# Patient Record
Sex: Female | Born: 1970 | Race: Black or African American | Hispanic: No | Marital: Single | Smoking: Former smoker
Health system: Southern US, Community
[De-identification: ages and names within clinical notes are randomized; demographics above are authoritative.]

---

## 2015-08-08 ENCOUNTER — Emergency Department
Admission: EM | Admit: 2015-08-08 | Discharge: 2015-08-08 | Disposition: A | Discharge status: Home / Self Care | Payer: Self-pay | Attending: Student | Admitting: Student

## 2015-08-08 DIAGNOSIS — M545 Low back pain: Secondary | ICD-10-CM | POA: Insufficient documentation

## 2015-08-08 DIAGNOSIS — B349 Viral infection, unspecified: Secondary | ICD-10-CM | POA: Insufficient documentation

## 2015-08-08 DIAGNOSIS — F17200 Nicotine dependence, unspecified, uncomplicated: Secondary | ICD-10-CM | POA: Insufficient documentation

## 2015-08-08 DIAGNOSIS — R609 Edema, unspecified: Secondary | ICD-10-CM | POA: Insufficient documentation

## 2015-08-08 DIAGNOSIS — G8929 Other chronic pain: Secondary | ICD-10-CM | POA: Insufficient documentation

## 2015-08-08 MED ORDER — GUAIFENESIN-CODEINE 100-10 MG/5ML PO SOLN: 10.0000 mL | Freq: Three times a day (TID) | ORAL | Status: AC | PRN

## 2015-08-08 NOTE — ED Notes (Signed)
Patient started feeling bad two days ago. Complains of fever at home, back pain, stiffness all over. Good appetite. Denies N/V but states two episodes of diarrhea today. Patient with normal skin color for ethnicity, skin warm and dry and moist mucus membranes.

## 2015-08-08 NOTE — Discharge Instructions (Signed)
Edema °Edema is an abnormal buildup of fluids. It is more common in your legs and thighs. Painless swelling of the feet and ankles is more likely as a person ages. It also is common in looser skin, like around your eyes. °HOME CARE  °· Keep the affected body part above the level of the heart while lying down. °· Do not sit still or stand for a long time. °· Do not put anything right under your knees when you lie down. °· Do not wear tight clothes on your upper legs. °· Exercise your legs to help the puffiness (swelling) go down. °· Wear elastic bandages or support stockings as told by your doctor. °· A low-salt diet may help lessen the puffiness. °· Only take medicine as told by your doctor. °GET HELP IF: °· Treatment is not working. °· You have heart, liver, or kidney disease and notice that your skin looks puffy or shiny. °· You have puffiness in your legs that does not get better when you raise your legs. °· You have sudden weight gain for no reason. °GET HELP RIGHT AWAY IF:  °· You have shortness of breath or chest pain. °· You cannot breathe when you lie down. °· You have pain, redness, or warmth in the areas that are puffy. °· You have heart, liver, or kidney disease and get edema all of a sudden. °· You have a fever and your symptoms get worse all of a sudden. °MAKE SURE YOU:  °· Understand these instructions. °· Will watch your condition. °· Will get help right away if you are not doing well or get worse. °  °This information is not intended to replace advice given to you by your health care provider. Make sure you discuss any questions you have with your health care provider. °  °Document Released: 02/06/2008 Document Revised: 08/25/2013 Document Reviewed: 06/12/2013 °Elsevier Interactive Patient Education ©2016 [employer] Inc[employer] ° °

## 2015-08-08 NOTE — ED Notes (Signed)
Pt complaining of flu like symptoms that started today. Pt states she has generalized body aches, and is complaining of sweats and chills. Pt also states her lower back is hurting and she is having swelling to LLE. [facility] for swelling to left leg and states all test results came back negative.

## 2015-08-08 NOTE — ED Provider Notes (Signed)
[facility] Emergency Department Provider Note ____________________________________________  Time seen: Approximately 8:56 PM  I have reviewed the triage vital signs and the nursing notes.   HISTORY  Chief Complaint Influenza and Foot Swelling   HPI Diane Washington is a 44 y.o. female who presents to the emergency department for evaluation of flu like symptoms x 2 days.She reports cough, chills, body aches, and diarrhea. She also reports swelling to the left ankle and foot that is chronic. She's had multiple evaluations for the swelling and no definitive diagnosis has been made. She has not been taking any medications for her flulike symptoms. She states that staying off the foot and elevating it seems to make some of the swelling go down, but it never completely goes away. She denies a change in sensation of the left foot or change in skin color or temperature. She denies injury.   History reviewed. No pertinent past medical history.  There are no active problems to display for this patient.   Past Surgical History  Procedure Laterality Date  . Cesarean section      Current Outpatient Rx  Name  Route  Sig  Dispense  Refill  . guaiFENesin-codeine 100-10 MG/5ML syrup   Oral   Take 10 mLs by mouth 3 (three) times daily as needed.   120 mL   0     Allergies Review of patient's allergies indicates no known allergies.  No family history on file.  Social History Social History  Substance Use Topics  . Smoking status: Light Tobacco Smoker  . Smokeless tobacco: None  . Alcohol Use: Yes     Comment: Occasionally    Review of Systems Constitutional: Positive for fever and chills Eyes: No visual changes. ENT: No sore throat. Cardiovascular: Denies chest pain. Respiratory: Denies shortness of breath. Positive for cough Gastrointestinal: No abdominal pain.  No nausea, no vomiting.  Positive for diarrhea.  No constipation. Genitourinary:  Negative for dysuria. Musculoskeletal: Positive for low back pain which is chronic and generalized body aches Skin: Negative for rash. Neurological: Negative for headaches, focal weakness or numbness.  10-point ROS otherwise negative.  ____________________________________________   PHYSICAL EXAM:  VITAL SIGNS: ED Triage Vitals  Enc Vitals Group     BP 08/08/15 2015 126/75 mmHg     Pulse Rate 08/08/15 2015 85     Resp 08/08/15 2015 20     Temp 08/08/15 2015 98.3 F (36.8 C)     Temp Source 08/08/15 2015 Oral     SpO2 08/08/15 2015 100 %     Weight 08/08/15 2015 212 lb (96.163 kg)     Height 08/08/15 2015 5\' 5"  (1.651 m)     Head Cir --      Peak Flow --      Pain Score 08/08/15 2017 10     Pain Loc --      Pain Edu? --      Excl. in GC? --     Constitutional: Alert and oriented. Well appearing and in no acute distress. Eyes: Conjunctivae are normal. PERRL. EOMI. Head: Atraumatic. Nose: Nasal congestion noted without rhinorrhea. Mucous membranes are boggy and erythematous. Mouth/Throat: Mucous membranes are moist.  Oropharynx non-erythematous. Neck: No stridor.   Cardiovascular: Normal rate, regular rhythm. Grossly normal heart sounds.  Good peripheral circulation. Respiratory: Normal respiratory effort.  No retractions. Scattered expiratory wheezes noted throughout the right, left is clear to auscultation. Gastrointestinal: Soft and nontender. No distention. No abdominal bruits. No CVA  tenderness. Musculoskeletal: Left ankle and foot with nonpitting peripheral edema. There is some associated hair loss in the pretibial area, just above the left ankle where the swelling starts, however there is no lesion or skin breakdown. Good dorsalis pedis pulses and sensation bilaterally. Neurologic:  Normal speech and language. No gross focal neurologic deficits are appreciated. No gait instability. Skin:  Skin is warm, dry and intact. No rash noted. Psychiatric: Mood and affect are  normal. Speech and behavior are normal.  ____________________________________________   LABS (all labs ordered are listed, but only abnormal results are displayed)  Labs Reviewed - No data to display ____________________________________________  EKG   ____________________________________________  RADIOLOGY  Not indicated ____________________________________________   PROCEDURES  Procedure(s) performed: None  Critical Care performed: No  ____________________________________________   INITIAL IMPRESSION / ASSESSMENT AND PLAN / ED COURSE  Pertinent labs & imaging results that were available during my care of the patient were reviewed by me and considered in my medical decision making (see chart for details).  Patient was advised that she will need to establish primary care provider for further evaluation of the left foot swelling. She was advised to use some compression stockings or Ace wrap when out of bed. Patient is not a daily smoker and symptoms started 2 days ago. She will be treated with Robitussin-AC and advised to increase fluid intake and take Tylenol or ibuprofen for body aches and fever. She was advised to return to the emergency department for symptoms that change or worsen if she is unable to schedule an appointment. ____________________________________________   FINAL CLINICAL IMPRESSION(S) / ED DIAGNOSES  Final diagnoses:  Viral syndrome  Peripheral edema      Chinita Pester, FNP 08/08/15 2132  Gayla Doss, MD 08/09/15 [phone]

## 2015-09-12 ENCOUNTER — Emergency Department: Payer: Self-pay

## 2015-09-12 ENCOUNTER — Emergency Department
Admission: EM | Admit: 2015-09-12 | Discharge: 2015-09-12 | Disposition: A | Discharge status: Home / Self Care | Payer: Self-pay | Attending: Emergency Medicine | Admitting: Emergency Medicine

## 2015-09-12 ENCOUNTER — Encounter: Payer: Self-pay | Admitting: Emergency Medicine

## 2015-09-12 DIAGNOSIS — Z87891 Personal history of nicotine dependence: Secondary | ICD-10-CM | POA: Insufficient documentation

## 2015-09-12 DIAGNOSIS — M7080 Other soft tissue disorders related to use, overuse and pressure of unspecified site: Secondary | ICD-10-CM

## 2015-09-12 DIAGNOSIS — Y998 Other external cause status: Secondary | ICD-10-CM | POA: Insufficient documentation

## 2015-09-12 DIAGNOSIS — Y9389 Activity, other specified: Secondary | ICD-10-CM | POA: Insufficient documentation

## 2015-09-12 DIAGNOSIS — Y9289 Other specified places as the place of occurrence of the external cause: Secondary | ICD-10-CM | POA: Insufficient documentation

## 2015-09-12 DIAGNOSIS — X58XXXA Exposure to other specified factors, initial encounter: Secondary | ICD-10-CM | POA: Insufficient documentation

## 2015-09-12 DIAGNOSIS — T148XXA Other injury of unspecified body region, initial encounter: Secondary | ICD-10-CM

## 2015-09-12 DIAGNOSIS — S4992XA Unspecified injury of left shoulder and upper arm, initial encounter: Secondary | ICD-10-CM | POA: Insufficient documentation

## 2015-09-12 MED ORDER — TRAMADOL HCL 50 MG PO TABS: 50.0000 mg | ORAL_TABLET | Freq: Four times a day (QID) | ORAL | Status: AC | PRN

## 2015-09-12 MED ORDER — TRAMADOL HCL 50 MG PO TABS
50.0000 mg | ORAL_TABLET | Freq: Once | ORAL | Status: AC
  Administered 2015-09-12: 50 mg via ORAL
  Filled 2015-09-12: qty 1

## 2015-09-12 MED ORDER — MELOXICAM 15 MG PO TABS: 15.0000 mg | ORAL_TABLET | Freq: Every day | ORAL | Status: AC

## 2015-09-12 MED ORDER — NAPROXEN 500 MG PO TABS
500.0000 mg | ORAL_TABLET | Freq: Once | ORAL | Status: AC
  Administered 2015-09-12: 500 mg via ORAL
  Filled 2015-09-12: qty 1

## 2015-09-12 NOTE — ED Notes (Signed)
Left shoulder pain for couple of days   W/o injury  No swelling or deformity noted

## 2015-09-12 NOTE — ED Notes (Signed)
L shoulder pain, denies injury to area, states she thinks it is how she drives.

## 2015-09-12 NOTE — ED Notes (Signed)
Left shoulder pain with no obvious injury. No bruising, joint feels in alignment. Pt denies injury.

## 2015-09-12 NOTE — ED Provider Notes (Signed)
[facility] Emergency Department Provider Note  ____________________________________________  Time seen: Approximately 2:56 PM  I have reviewed the triage vital signs and the nursing notes.   HISTORY  Chief Complaint Shoulder Pain    HPI Diane Washington is a 45 y.o. female patient complaining of left shoulder pain for 1 week. 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 He denies any provocative incident for her complaint of pain. Patient rates the pain as 8/10 and describes pain as sharp. No palliative measures taken of his complaint.   History reviewed. No pertinent past medical history.  There are no active problems to display for this patient.   Past Surgical History  Procedure Laterality Date  . Cesarean section      Current Outpatient Rx  Name  Route  Sig  Dispense  Refill  . guaiFENesin-codeine 100-10 MG/5ML syrup   Oral   Take 10 mLs by mouth 3 (three) times daily as needed.   120 mL   0   . meloxicam (MOBIC) 15 MG tablet   Oral   Take 1 tablet (15 mg total) by mouth daily.   30 tablet   2   . traMADol (ULTRAM) 50 MG tablet   Oral   Take 1 tablet (50 mg total) by mouth every 6 (six) hours as needed for moderate pain.   12 tablet   0     Allergies Review of patient's allergies indicates no known allergies.  No family history on file.  Social History Social History  Substance Use Topics  . Smoking status: Former Games developer  . Smokeless tobacco: None  . Alcohol Use: Yes     Comment: Occasionally    Review of Systems Constitutional: No fever/chills Eyes: No visual changes. ENT: No sore throat. Cardiovascular: Denies chest pain. Respiratory: Denies shortness of breath. Gastrointestinal: No abdominal pain.  No nausea, no vomiting.  No diarrhea.  No constipation. Genitourinary: Negative for dysuria. Musculoskeletal: Left shoulder pain Skin: Negative for rash. Neurological:  Negative for headaches, focal weakness or numbness. 10-point ROS otherwise negative.  ____________________________________________   PHYSICAL EXAM:  VITAL SIGNS: ED Triage Vitals  Enc Vitals Group     BP 09/12/15 1312 132/83 mmHg     Pulse Rate 09/12/15 1312 70     Resp 09/12/15 1312 18     Temp 09/12/15 1312 98.1 F (36.7 C)     Temp Source 09/12/15 1312 Oral     SpO2 09/12/15 1312 100 %     Weight 09/12/15 1312 212 lb (96.163 kg)     Height 09/12/15 1312 5\' 5"  (1.651 m)     Head Cir --      Peak Flow --      Pain Score 09/12/15 1306 8     Pain Loc --      Pain Edu? --      Excl. in GC? --     Constitutional: Alert and oriented. Well appearing and in no acute distress. Eyes: Conjunctivae are normal. PERRL. EOMI. Head: Atraumatic. Nose: No congestion/rhinnorhea. Mouth/Throat: Mucous membranes are moist.  Oropharynx non-erythematous. Neck: No stridor.  No cervical spine tenderness to palpation. Hematological/Lymphatic/Immunilogical: No cervical lymphadenopathy. Cardiovascular: Normal rate, regular rhythm. Grossly normal heart sounds.  Good peripheral circulation. Respiratory: Normal respiratory effort.  No retractions. Lungs CTAB. Gastrointestinal: Soft and nontender. No distention. No abdominal bruits. No CVA tenderness. Musculoskeletal: No obvious deformity of the left shoulder. His range of motion limited by complaining  of pain . Patient has some moderate guarding palpation at the [facility].  Neurologic:  Normal speech and language. No gross focal neurologic deficits are appreciated. No gait instability. Skin:  Skin is warm, dry and intact. No rash noted. Psychiatric: Mood and affect are normal. Speech and behavior are normal.  ____________________________________________   LABS (all labs ordered are listed, but only abnormal results are displayed)  Labs Reviewed - No data to  display ____________________________________________  EKG   ____________________________________________  RADIOLOGY  No acute findings on x-ray. ____________________________________________   PROCEDURES  Procedure(s) performed: None  Critical Care performed: No  ____________________________________________   INITIAL IMPRESSION / ASSESSMENT AND PLAN / ED COURSE  Pertinent labs & imaging results that were available during my care of the patient were reviewed by me and considered in my medical decision making (see chart for details).  Repetitive motion injury to the right upper shoulder. Patient given discharge care instructions and a prescription for meloxicam and and tramadol. Patient advised to [facility] condition persists ____________________________________________   FINAL CLINICAL IMPRESSION(S) / ED DIAGNOSES  Final diagnoses:  Repetitive motion injury      Joni ReiningRonald K Smith, PA-C 09/12/15 1527  Emily FilbertJonathan E Williams, MD 09/12/15 [phone]

## 2015-10-20 ENCOUNTER — Emergency Department
Admission: EM | Admit: 2015-10-20 | Discharge: 2015-10-20 | Disposition: A | Discharge status: Home / Self Care | Payer: Self-pay | Attending: Emergency Medicine | Admitting: Emergency Medicine

## 2015-10-20 ENCOUNTER — Encounter: Payer: Self-pay | Admitting: Emergency Medicine

## 2015-10-20 DIAGNOSIS — Z87891 Personal history of nicotine dependence: Secondary | ICD-10-CM | POA: Insufficient documentation

## 2015-10-20 DIAGNOSIS — A0811 Acute gastroenteropathy due to Norwalk agent: Secondary | ICD-10-CM | POA: Insufficient documentation

## 2015-10-20 LAB — URINALYSIS COMPLETE WITH MICROSCOPIC (ARMC ONLY)
BACTERIA UA: NONE SEEN
Bilirubin Urine: NEGATIVE
GLUCOSE, UA: NEGATIVE mg/dL
HGB URINE DIPSTICK: NEGATIVE
Ketones, ur: NEGATIVE mg/dL
Nitrite: NEGATIVE
PROTEIN: NEGATIVE mg/dL
Specific Gravity, Urine: 1.031 — ABNORMAL HIGH (ref 1.005–1.030)
pH: 5 (ref 5.0–8.0)

## 2015-10-20 LAB — COMPREHENSIVE METABOLIC PANEL
ALBUMIN: 4.3 g/dL (ref 3.5–5.0)
ALT: 14 U/L (ref 14–54)
ANION GAP: 6 (ref 5–15)
AST: 20 U/L (ref 15–41)
Alkaline Phosphatase: 61 U/L (ref 38–126)
BUN: 16 mg/dL (ref 6–20)
CHLORIDE: 107 mmol/L (ref 101–111)
CO2: 24 mmol/L (ref 22–32)
Calcium: 8.8 mg/dL — ABNORMAL LOW (ref 8.9–10.3)
Creatinine, Ser: 0.91 mg/dL (ref 0.44–1.00)
GFR calc Af Amer: >60 mL/min (ref >60)
GFR calc non Af Amer: >60 mL/min (ref >60)
GLUCOSE: 116 mg/dL — AB (ref 65–99)
POTASSIUM: 3.7 mmol/L (ref 3.5–5.1)
SODIUM: 137 mmol/L (ref 135–145)
Total Bilirubin: 0.5 mg/dL (ref 0.3–1.2)
Total Protein: 7.8 g/dL (ref 6.5–8.1)

## 2015-10-20 LAB — CBC
HEMATOCRIT: 39.4 % (ref 35.0–47.0)
HEMOGLOBIN: 12.9 g/dL (ref 12.0–16.0)
MCH: 30 pg (ref 26.0–34.0)
MCHC: 32.7 g/dL (ref 32.0–36.0)
MCV: 91.5 fL (ref 80.0–100.0)
Platelets: 224 10*3/uL (ref 150–440)
RBC: 4.3 MIL/uL (ref 3.80–5.20)
RDW: 13.4 % (ref 11.5–14.5)
WBC: 6.7 10*3/uL (ref 3.6–11.0)

## 2015-10-20 LAB — LIPASE, BLOOD: LIPASE: 45 U/L (ref 11–51)

## 2015-10-20 MED ORDER — ONDANSETRON 4 MG PO TBDP
4.0000 mg | ORAL_TABLET | Freq: Once | ORAL | Status: AC
  Administered 2015-10-20: 4 mg via ORAL
  Filled 2015-10-20: qty 1

## 2015-10-20 MED ORDER — ONDANSETRON HCL 4 MG PO TABS: 4.0000 mg | ORAL_TABLET | Freq: Every day | ORAL | Status: AC | PRN

## 2015-10-20 MED ORDER — FAMOTIDINE 20 MG PO TABS: 20.0000 mg | ORAL_TABLET | Freq: Two times a day (BID) | ORAL | Status: AC

## 2015-10-20 MED ORDER — FAMOTIDINE 20 MG PO TABS
20.0000 mg | ORAL_TABLET | Freq: Once | ORAL | Status: AC
  Administered 2015-10-20: 20 mg via ORAL
  Filled 2015-10-20: qty 1

## 2015-10-20 MED ORDER — LOPERAMIDE HCL 2 MG PO CAPS
4.0000 mg | ORAL_CAPSULE | Freq: Once | ORAL | Status: AC
  Administered 2015-10-20: 4 mg via ORAL
  Filled 2015-10-20: qty 2

## 2015-10-20 NOTE — ED Provider Notes (Signed)
[facility] Emergency Department Provider Note     Time seen: ----------------------------------------- 12:35 PM on 10/20/2015 -----------------------------------------    I have reviewed the triage vital signs and the nursing notes.   HISTORY  Chief Complaint Emesis and Diarrhea    HPI Diane Washington is a 45 y.o. female who presents to the ER with diarrhea that has been copious and watery, vomiting and acid reflux for the last 24 hours. Patient states she ate yesterday and subsequently had acid indigestion followed by vomiting and she has had nonstop diarrhea. Nothing makes her symptoms better. She denies history of same.   History reviewed. No pertinent past medical history.  There are no active problems to display for this patient.   Past Surgical History  Procedure Laterality Date  . Cesarean section      Allergies Review of patient's allergies indicates no known allergies.  Social History Social History  Substance Use Topics  . Smoking status: Former Games developer  . Smokeless tobacco: None  . Alcohol Use: Yes     Comment: Occasionally    Review of Systems Constitutional: Negative for fever. Eyes: Negative for visual changes. ENT: Negative for sore throat. Cardiovascular: Negative for chest pain. Respiratory: Negative for shortness of breath. Gastrointestinal: Negative for abdominal pain, positive for vomiting and diarrhea Genitourinary: Negative for dysuria. Musculoskeletal: Negative for back pain. Skin: Negative for rash. Neurological: Negative for headaches, focal weakness or numbness.  10-point ROS otherwise negative.  ____________________________________________   PHYSICAL EXAM:  VITAL SIGNS: ED Triage Vitals  Enc Vitals Group     BP 10/20/15 1103 120/72 mmHg     Pulse Rate 10/20/15 1103 95     Resp 10/20/15 1103 18     Temp 10/20/15 1103 98.8 F (37.1 C)     Temp Source 10/20/15 1103 Oral     SpO2 10/20/15 1103  100 %     Weight 10/20/15 1103 215 lb (97.523 kg)     Height 10/20/15 1103  (1.651 m)     Head Cir --      Peak Flow --      Pain Score 10/20/15 1104 8     Pain Loc --      Pain Edu? --      Excl. in GC? --     Constitutional: Alert and oriented. Well appearing and in no distress. Eyes: Conjunctivae are normal. PERRL. Normal extraocular movements. ENT   Head: Normocephalic and atraumatic.   Nose: No congestion/rhinnorhea.   Mouth/Throat: Mucous membranes are moist.   Neck: No stridor. Cardiovascular: Normal rate, regular rhythm. Normal and symmetric distal pulses are present in all extremities. No murmurs, rubs, or gallops. Respiratory: Normal respiratory effort without tachypnea nor retractions. Breath sounds are clear and equal bilaterally. No wheezes/rales/rhonchi. Gastrointestinal: Soft and nontender. No distention. No abdominal bruits.  Musculoskeletal: Nontender with normal range of motion in all extremities. No joint effusions.  No lower extremity tenderness nor edema. Neurologic:  Normal speech and language. No gross focal neurologic deficits are appreciated.  Skin:  Skin is warm, dry and intact. No rash noted. Psychiatric: Mood and affect are normal. Speech and behavior are normal. Patient exhibits appropriate insight and judgment. ___________________________________________  ED COURSE:  Pertinent labs & imaging results that were available during my care of the patient were reviewed by me and considered in my medical decision making (see chart for details). Patient is in no acute distress, likely Norovirus infection. Patient was given antiemetics and antidiarrheal agents and  started on Pepcid. She'll be prescribed similar and encouraged to have follow-up with her doctor. ____________________________________________    LABS (pertinent positives/negatives)  Labs Reviewed  COMPREHENSIVE METABOLIC PANEL - Abnormal; Notable for the following:    Glucose, Bld  116 (*)    Calcium 8.8 (*)    All other components within normal limits  URINALYSIS COMPLETEWITH MICROSCOPIC ([facility] ONLY) - Abnormal; Notable for the following:    Color, Urine YELLOW (*)    APPearance HAZY (*)    Specific Gravity, Urine 1.031 (*)    Leukocytes, UA TRACE (*)    Squamous Epithelial / LPF 6-30 (*)    All other components within normal limits  LIPASE, BLOOD  CBC  ____________________________________________  FINAL ASSESSMENT AND PLAN  Norovirus infection  Plan: Patient with labs as dictated above. As dictated above, she will continue outpatient follow-up with oral antiemetics and an acids.   Emily Filbert, MD   Emily Filbert, MD 10/20/15 [phone]

## 2015-10-20 NOTE — Discharge Instructions (Signed)
Norovirus Infection °A norovirus infection is caused by exposure to a virus in a group of similar viruses (noroviruses). This type of infection causes inflammation in your stomach and intestines (gastroenteritis). Norovirus is the most common cause of gastroenteritis. It also causes food poisoning. °Anyone can get a norovirus infection. It spreads very easily (contagious). You can get it from contaminated food, water, surfaces, or other people. Norovirus is found in the stool or vomit of infected people. You can spread the infection as soon as you feel sick until 2 weeks after you recover.  °Symptoms usually begin within 2 days after you become infected. Most norovirus symptoms affect the digestive system. °CAUSES °Norovirus infection is caused by contact with norovirus. You can catch norovirus if you: °· Eat or drink something contaminated with norovirus. °· Touch surfaces or objects contaminated with norovirus and then put your hand in your mouth. °· Have direct contact with an infected person who has symptoms. °· Share food, drink, or utensils with someone with who is sick with norovirus. °SIGNS AND SYMPTOMS °Symptoms of norovirus may include: °· Nausea. °· Vomiting. °· Diarrhea. °· Stomach cramps. °· Fever. °· Chills. °· Headache. °· Muscle aches. °· Tiredness. °DIAGNOSIS °Your health care provider may suspect norovirus based on your symptoms and physical exam. Your health care provider may also test a sample of your stool or vomit for the virus.  °TREATMENT °There is no specific treatment for norovirus. Most people get better without treatment in about 2 days. °HOME CARE INSTRUCTIONS °· Replace lost fluids by drinking plenty of water or rehydration fluids containing important minerals called electrolytes. This prevents dehydration. Drink enough fluid to keep your urine clear or pale yellow. °· Do not prepare food for others while you are infected. Wait at least 3 days after recovering from the illness to do  that. °PREVENTION  °· Wash your hands often, especially after using the toilet or changing a diaper. °· Wash fruits and vegetables thoroughly before preparing or serving them. °· Throw out any food that a sick person may have touched. °· Disinfect contaminated surfaces immediately after someone in the household has been sick. Use a bleach-based household cleaner. °· Immediately remove and wash soiled clothes or sheets. °SEEK MEDICAL CARE IF: °· Your vomiting, diarrhea, and stomach pain is getting worse. °· Your symptoms of norovirus do not go away after 2-3 days. °SEEK IMMEDIATE MEDICAL CARE IF:  °You develop symptoms of dehydration that do not improve with fluid replacement. This may include: °· Excessive sleepiness. °· Lack of tears. °· Dry mouth. °· Dizziness when standing. °· Weak pulse. °  °This information is not intended to replace advice given to you by your health care provider. Make sure you discuss any questions you have with your health care provider. °  °Document Released: 11/10/2002 Document Revised: 09/10/2014 Document Reviewed: 01/28/2014 °Elsevier Interactive Patient Education ©2016 [employer] Inc. ° °

## 2015-10-20 NOTE — ED Notes (Signed)
Pt here with c/o diarrhea, vomiting and acid reflux for one day now.

## 2016-05-14 IMAGING — CR DG SHOULDER 2+V*L*
1 series · 3 of 3 positions shown · non-contrast
Comparison: None.

CLINICAL DATA: Left shoulder pain for 10 days

EXAM:
LEFT SHOULDER - 2+ VIEW

[Series 1: dg shoulder left · 0.14mm/px · 3 of 3 slices shown]
[im 1/3]
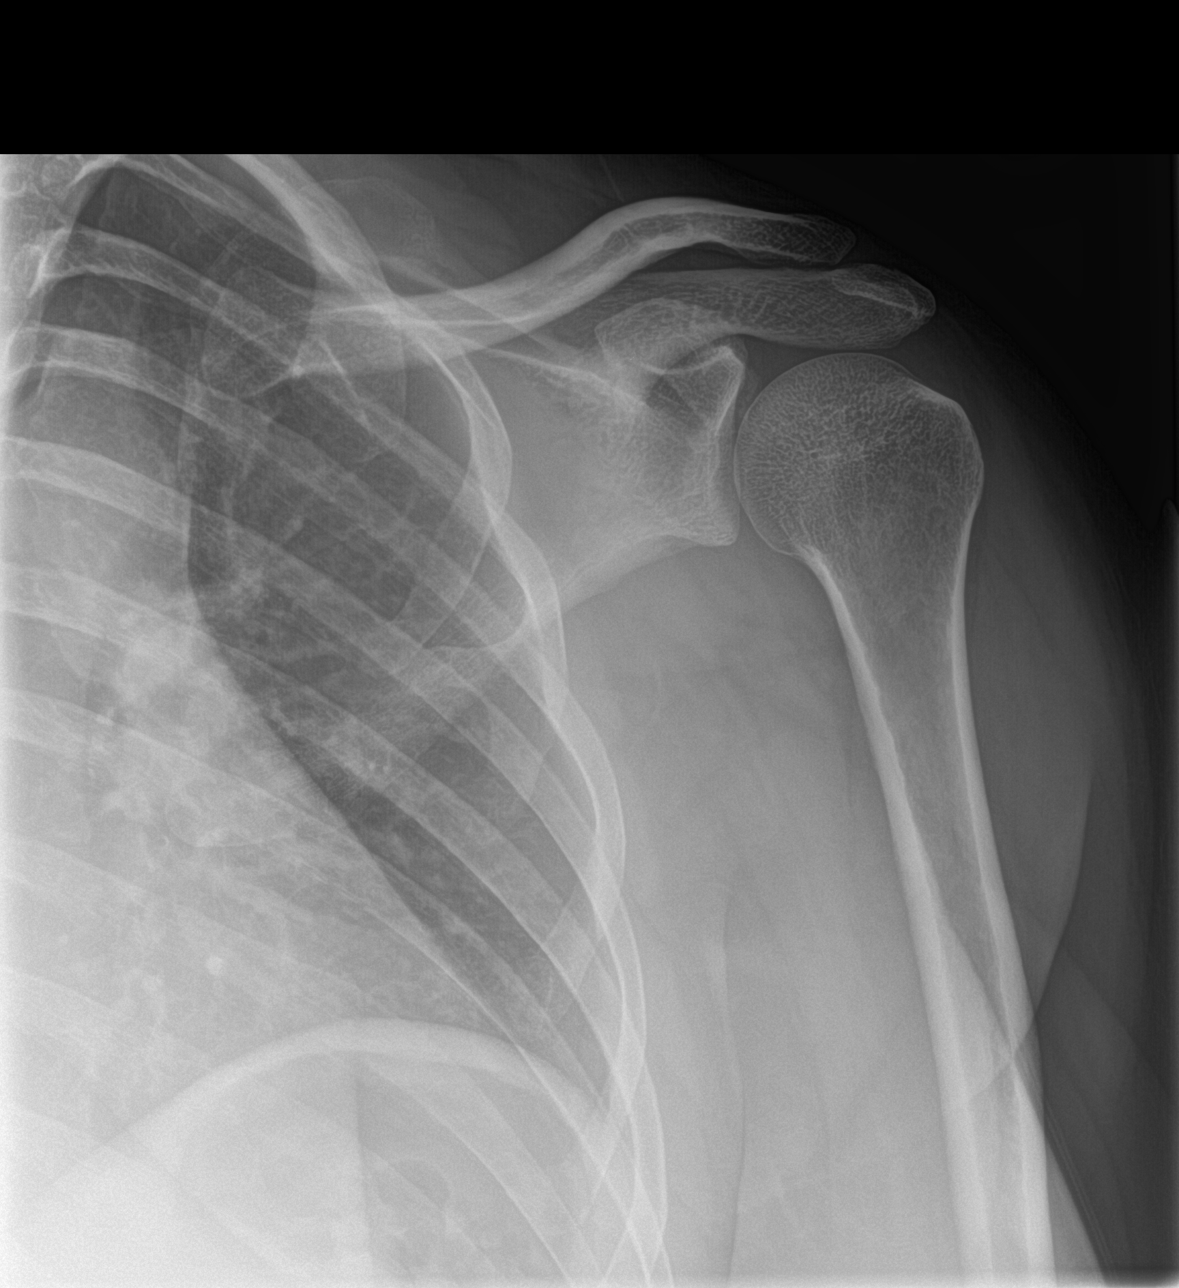
[im 2/3]
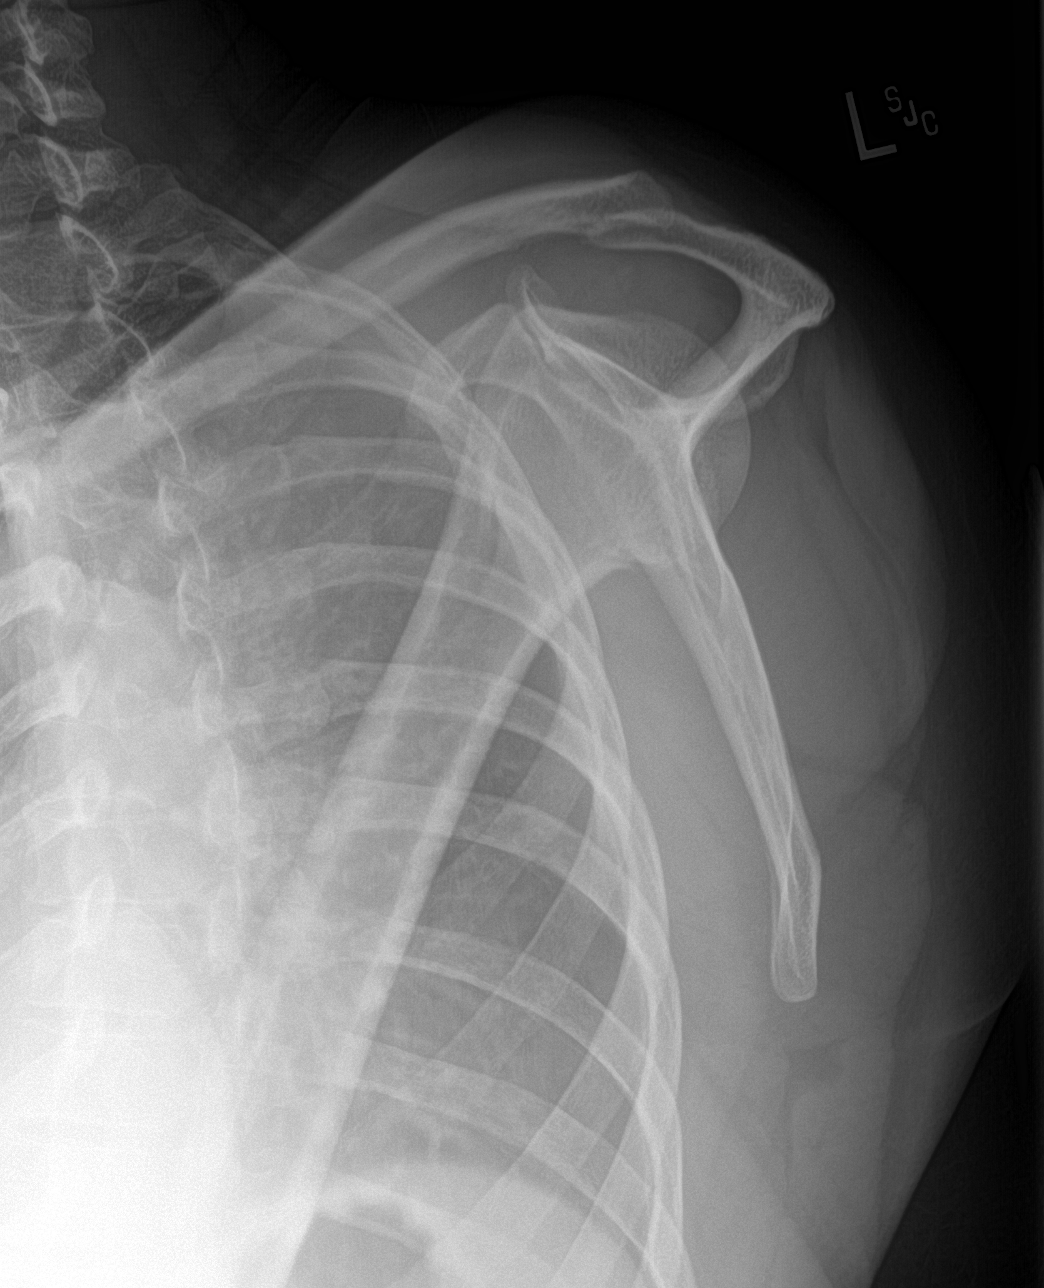
[im 3/3]
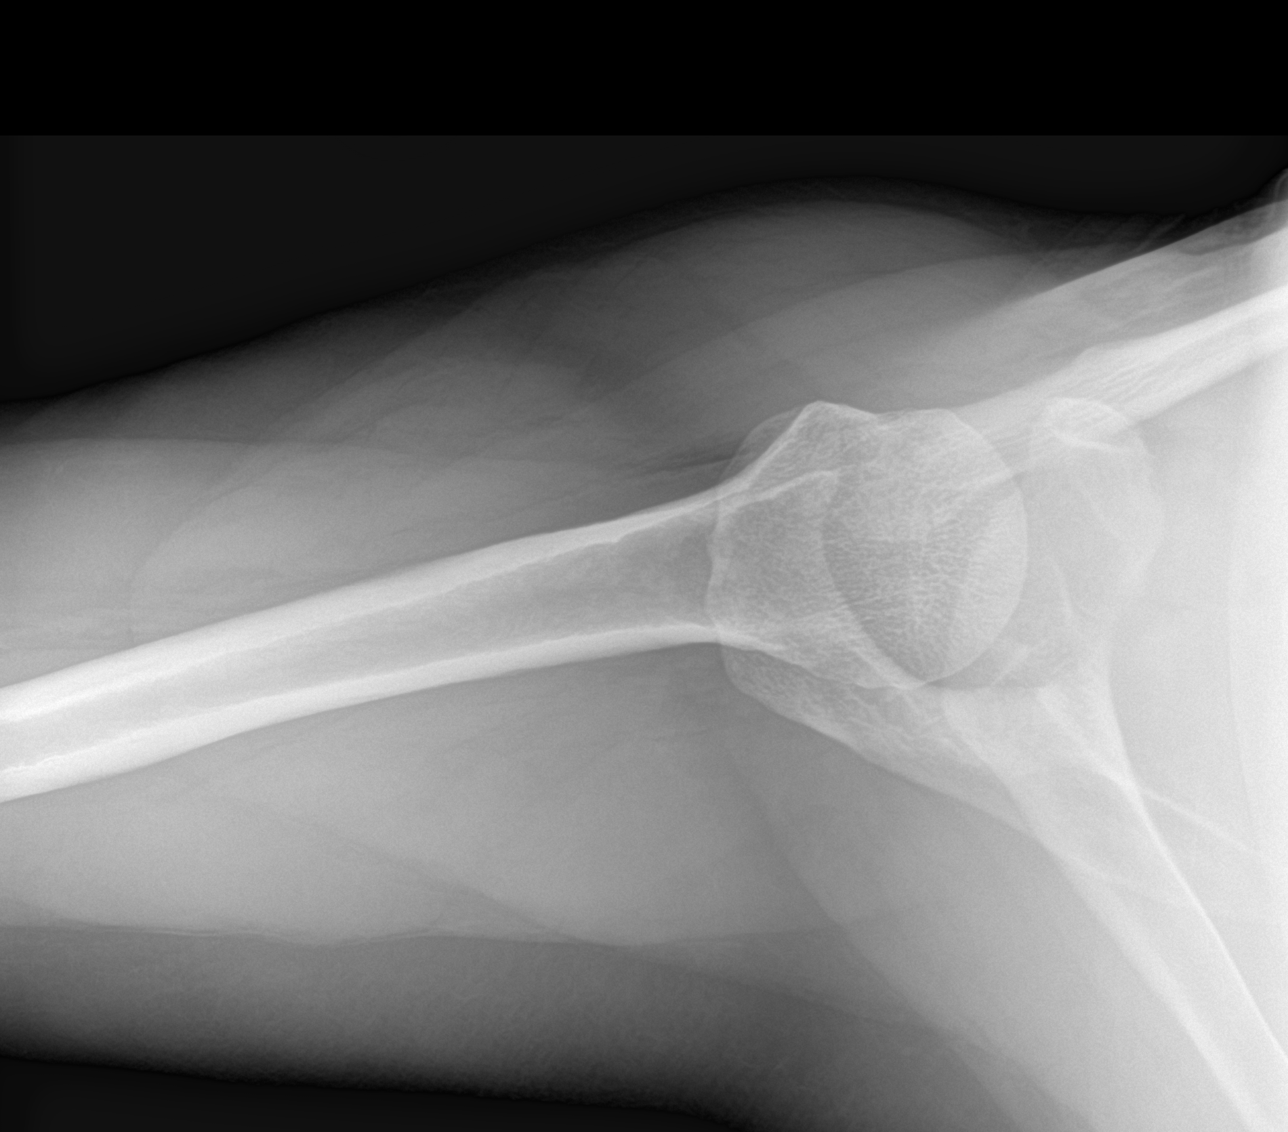

[3 of 3 positions shown; findings below may reference images not displayed]

FINDINGS: Three views of left shoulder submitted. No acute fracture or
subluxation. No radiopaque foreign body. AC joint and glenohumeral
joint are preserved.
IMPRESSION: Negative.

## 2018-10-13 ENCOUNTER — Encounter: Payer: Self-pay | Admitting: Emergency Medicine

## 2018-10-13 ENCOUNTER — Emergency Department: Payer: 59

## 2018-10-13 ENCOUNTER — Other Ambulatory Visit: Payer: Self-pay

## 2018-10-13 ENCOUNTER — Emergency Department
Admission: EM | Admit: 2018-10-13 | Discharge: 2018-10-13 | Disposition: A | Discharge status: Home / Self Care | Payer: 59 | Attending: Emergency Medicine | Admitting: Emergency Medicine

## 2018-10-13 DIAGNOSIS — Z87891 Personal history of nicotine dependence: Secondary | ICD-10-CM | POA: Diagnosis not present

## 2018-10-13 DIAGNOSIS — R079 Chest pain, unspecified: Secondary | ICD-10-CM | POA: Diagnosis not present

## 2018-10-13 LAB — CBC
HCT: 39.3 % (ref 36.0–46.0)
HEMOGLOBIN: 12.9 g/dL (ref 12.0–15.0)
MCH: 29.6 pg (ref 26.0–34.0)
MCHC: 32.8 g/dL (ref 30.0–36.0)
MCV: 90.1 fL (ref 80.0–100.0)
Platelets: 306 10*3/uL (ref 150–400)
RBC: 4.36 MIL/uL (ref 3.87–5.11)
RDW: 13.2 % (ref 11.5–15.5)
WBC: 5.9 10*3/uL (ref 4.0–10.5)
nRBC: 0 % (ref 0.0–0.2)

## 2018-10-13 LAB — BASIC METABOLIC PANEL
ANION GAP: 5 (ref 5–15)
BUN: 17 mg/dL (ref 6–20)
CALCIUM: 9.5 mg/dL (ref 8.9–10.3)
CO2: 29 mmol/L (ref 22–32)
Chloride: 104 mmol/L (ref 98–111)
Creatinine, Ser: 0.92 mg/dL (ref 0.44–1.00)
Glucose, Bld: 92 mg/dL (ref 70–99)
POTASSIUM: 3.7 mmol/L (ref 3.5–5.1)
Sodium: 138 mmol/L (ref 135–145)

## 2018-10-13 LAB — TROPONIN I: Troponin I: <0.03 ng/mL (ref <0.03)

## 2018-10-13 NOTE — ED Provider Notes (Signed)
[facility] Emergency Department Provider Note       Time seen: ----------------------------------------- 3:21 PM on 10/13/2018 -----------------------------------------   I have reviewed the triage vital signs and the nursing notes.  HISTORY   Chief Complaint Chest Pain    HPI Diane Washington is a 48 y.o. female with no significant past medical history who presents to the ED for chest pain.  Patient states she had acute onset chest pain that started this morning.  Pain was noted to the left chest and constant.  She denies any cardiac risk factors, denies any recent illness.  History reviewed. No pertinent past medical history.  There are no active problems to display for this patient.   Past Surgical History:  Procedure Laterality Date  . CESAREAN SECTION      Allergies Patient has no known allergies.  Social History Social History   Tobacco Use  . Smoking status: Former Games developer  . Smokeless tobacco: Never Used  Substance Use Topics  . Alcohol use: Yes    Comment: Occasionally  . Drug use: Not on file   Review of Systems Constitutional: Negative for fever. Cardiovascular: Positive for chest pain Respiratory: Negative for shortness of breath. Musculoskeletal: Negative for back pain. Skin: Negative for rash. Neurological: Negative for headaches, focal weakness or numbness.  All systems negative/normal/unremarkable except as stated in the HPI  ____________________________________________   PHYSICAL EXAM:  VITAL SIGNS: ED Triage Vitals  Enc Vitals Group     BP 10/13/18 1431 128/81     Pulse Rate 10/13/18 1431 82     Resp 10/13/18 1431 18     Temp 10/13/18 1431 98.1 F (36.7 C)     Temp Source 10/13/18 1431 Oral     SpO2 10/13/18 1431 100 %     Weight 10/13/18 1432 212 lb (96.2 kg)     Height 10/13/18 1432 5\' 5"  (1.651 m)     Head Circumference --      Peak Flow --      Pain Score 10/13/18 1432 7     Pain Loc --    Pain Edu? --      Excl. in GC? --    Constitutional: Alert and oriented. Well appearing and in no distress. Eyes: Conjunctivae are normal. Normal extraocular movements. ENT      Head: Normocephalic and atraumatic.      Nose: No congestion/rhinnorhea.      Mouth/Throat: Mucous membranes are moist.      Neck: No stridor. Cardiovascular: Normal rate, regular rhythm. No murmurs, rubs, or gallops. Respiratory: Normal respiratory effort without tachypnea nor retractions. Breath sounds are clear and equal bilaterally. No wheezes/rales/rhonchi. Gastrointestinal: Soft and nontender. Normal bowel sounds Musculoskeletal: Nontender with normal range of motion in extremities. No lower extremity tenderness nor edema. Neurologic:  Normal speech and language. No gross focal neurologic deficits are appreciated.  Skin:  Skin is warm, dry and intact. No rash noted. Psychiatric: Mood and affect are normal. Speech and behavior are normal.  ____________________________________________  EKG: Interpreted by me.  Sinus rhythm with a rate of 79 bpm, normal PR interval, normal QRS, normal QT  ____________________________________________  ED COURSE:  As part of my medical decision making, I reviewed the following data within the electronic MEDICAL RECORD NUMBER History obtained from family if available, nursing notes, old chart and ekg, as well as notes from prior ED visits. Patient presented for chest pain, we will assess with labs and imaging as indicated at this time.  Procedures ____________________________________________   LABS (pertinent positives/negatives)  Labs Reviewed  BASIC METABOLIC PANEL  CBC  TROPONIN I  POC URINE PREG, ED    RADIOLOGY Images were viewed by me  X-ray is unremarkable  ____________________________________________   DIFFERENTIAL DIAGNOSIS   Musculoskeletal pain, GERD, anxiety, PE, unstable angina  FINAL ASSESSMENT AND PLAN  Chest pain   Plan: The patient had  presented for nonspecific chest pain. Patient's labs were reassuring. Patient's imaging was also reassuring.  She is low risk for ACS.  She is cleared for outpatient follow-up.   Ulice Dash, MD    Note: This note was generated in part or whole with voice recognition software. Voice recognition is usually quite accurate but there are transcription errors that can and very often do occur. I apologize for any typographical errors that were not detected and corrected.     Emily Filbert, MD 10/13/18 1538

## 2018-10-13 NOTE — ED Triage Notes (Signed)
Here for acute onset chest pain that started this morning.  Pain to left chest and constant. No SHOB, nausea, or vomiting. Unlabored. Color WNL. VSS.

## 2018-12-10 ENCOUNTER — Telehealth: Payer: 59 | Admitting: Cardiovascular Disease

## 2018-12-11 ENCOUNTER — Telehealth: Payer: Self-pay | Admitting: Cardiovascular Disease

## 2021-12-05 ENCOUNTER — Telehealth: Payer: Self-pay

## 2021-12-05 NOTE — Telephone Encounter (Signed)
CALLED PATIENT NO ANSWER LEFT VOICEMAIL FOR A CALL BACK ? ?

## 2021-12-06 ENCOUNTER — Telehealth: Payer: Self-pay

## 2021-12-06 NOTE — Telephone Encounter (Signed)
CALLED PATIENT NO ANSWER LEFT VOICEMAIL FOR A CALL BACK ? ?
